# Patient Record
Sex: Female | Born: 1990 | Race: Black or African American | Hispanic: No | Marital: Single | Smoking: Current every day smoker
Health system: Southern US, Community
[De-identification: ages and names within clinical notes are randomized; demographics above are authoritative.]

---
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
## 2011-03-29 ENCOUNTER — Emergency Department: Payer: Self-pay | Admitting: Emergency Medicine

## 2011-03-29 LAB — COMPREHENSIVE METABOLIC PANEL
Albumin: 3.5 g/dL (ref 3.4–5.0)
Alkaline Phosphatase: 53 U/L (ref 50–136)
Anion Gap: 8 (ref 7–16)
Calcium, Total: 8.7 mg/dL (ref 8.5–10.1)
Chloride: 108 mmol/L — ABNORMAL HIGH (ref 98–107)
Co2: 26 mmol/L (ref 21–32)
Creatinine: 0.89 mg/dL (ref 0.60–1.30)
EGFR (African American): >60
EGFR (Non-African Amer.): >60
Glucose: 77 mg/dL (ref 65–99)
Osmolality: 282 (ref 275–301)
SGOT(AST): 26 U/L (ref 15–37)
SGPT (ALT): 19 U/L
Sodium: 142 mmol/L (ref 136–145)

## 2011-03-29 LAB — CBC
HCT: 39.7 % (ref 35.0–47.0)
HGB: 13.4 g/dL (ref 12.0–16.0)
MCH: 30.7 pg (ref 26.0–34.0)
MCV: 91 fL (ref 80–100)
RBC: 4.37 10*6/uL (ref 3.80–5.20)
WBC: 6 10*3/uL (ref 3.6–11.0)

## 2011-03-29 LAB — DRUG SCREEN, URINE
Barbiturates, Ur Screen: NEGATIVE (ref ?–200)
Benzodiazepine, Ur Scrn: NEGATIVE (ref ?–200)
Cannabinoid 50 Ng, Ur ~~LOC~~: POSITIVE (ref ?–50)
Cocaine Metabolite,Ur ~~LOC~~: NEGATIVE (ref ?–300)
MDMA (Ecstasy)Ur Screen: NEGATIVE (ref ?–500)
Phencyclidine (PCP) Ur S: NEGATIVE (ref ?–25)
Tricyclic, Ur Screen: NEGATIVE (ref ?–1000)

## 2011-03-29 LAB — URINALYSIS, COMPLETE
Bilirubin,UR: NEGATIVE
Glucose,UR: NEGATIVE mg/dL (ref 0–75)
Leukocyte Esterase: NEGATIVE
Nitrite: NEGATIVE
Protein: 30
Specific Gravity: 1.031 (ref 1.003–1.030)
Squamous Epithelial: 9

## 2011-03-29 LAB — ETHANOL: Ethanol: <3 mg/dL

## 2011-03-29 LAB — TSH: Thyroid Stimulating Horm: 0.87 u[IU]/mL

## 2014-02-22 ENCOUNTER — Emergency Department (HOSPITAL_COMMUNITY)
Admission: EM | Admit: 2014-02-22 | Discharge: 2014-02-22 | Disposition: A | Discharge status: Home / Self Care | Payer: Self-pay | Attending: Emergency Medicine | Admitting: Emergency Medicine

## 2014-02-22 ENCOUNTER — Encounter (HOSPITAL_COMMUNITY): Payer: Self-pay | Admitting: *Deleted

## 2014-02-22 DIAGNOSIS — K088 Other specified disorders of teeth and supporting structures: Secondary | ICD-10-CM | POA: Insufficient documentation

## 2014-02-22 DIAGNOSIS — K0889 Other specified disorders of teeth and supporting structures: Secondary | ICD-10-CM

## 2014-02-22 DIAGNOSIS — Z720 Tobacco use: Secondary | ICD-10-CM | POA: Insufficient documentation

## 2014-02-22 MED ORDER — KETOROLAC TROMETHAMINE 10 MG PO TABS
10.0000 mg | ORAL_TABLET | Freq: Once | ORAL | Status: AC
  Administered 2014-02-22: 10 mg via ORAL
  Filled 2014-02-22: qty 1

## 2014-02-22 MED ORDER — AMOXICILLIN 250 MG PO CAPS
500.0000 mg | ORAL_CAPSULE | Freq: Once | ORAL | Status: AC
  Administered 2014-02-22: 500 mg via ORAL
  Filled 2014-02-22: qty 2

## 2014-02-22 MED ORDER — HYDROCODONE-ACETAMINOPHEN 5-325 MG PO TABS
2.0000 | ORAL_TABLET | Freq: Once | ORAL | Status: AC
Start: 2014-02-22 — End: 2014-02-22
  Administered 2014-02-22: 2 via ORAL
  Filled 2014-02-22: qty 2

## 2014-02-22 MED ORDER — HYDROCODONE-ACETAMINOPHEN 5-325 MG PO TABS: 1.0000 | ORAL_TABLET | ORAL | Status: DC | PRN

## 2014-02-22 MED ORDER — IBUPROFEN 800 MG PO TABS: 800.0000 mg | ORAL_TABLET | Freq: Three times a day (TID) | ORAL | Status: DC

## 2014-02-22 MED ORDER — PROMETHAZINE HCL 12.5 MG PO TABS
12.5000 mg | ORAL_TABLET | Freq: Once | ORAL | Status: AC
  Administered 2014-02-22: 12.5 mg via ORAL
  Filled 2014-02-22: qty 1

## 2014-02-22 MED ORDER — AMOXICILLIN 500 MG PO CAPS: 500.0000 mg | ORAL_CAPSULE | Freq: Three times a day (TID) | ORAL | Status: DC

## 2014-02-22 NOTE — Discharge Instructions (Signed)
IT IS IMPORTANT THAT YOU SEE A DENTIST AS SOON AS POSSIBLE. PLEASE USE AMOXIL AND IBUPROFEN DAILY  WITH FOOD. MAY USE NORCO FOR PAIN IF NEEDED. THIS MEDICATION MAY CAUSE DROWSINESS, USE WITH CAUTION. Dental Pain A tooth ache may be caused by cavities (tooth decay). Cavities expose the nerve of the tooth to air and hot or cold temperatures. It may come from an infection or abscess (also called a boil or furuncle) around your tooth. It is also often caused by dental caries (tooth decay). This causes the pain you are having. DIAGNOSIS  Your caregiver can diagnose this problem by exam. TREATMENT   If caused by an infection, it may be treated with medications which kill germs (antibiotics) and pain medications as prescribed by your caregiver. Take medications as directed.  Only take over-the-counter or prescription medicines for pain, discomfort, or fever as directed by your caregiver.  Whether the tooth ache today is caused by infection or dental disease, you should see your dentist as soon as possible for further care. SEEK MEDICAL CARE IF: The exam and treatment you received today has been provided on an emergency basis only. This is not a substitute for complete medical or dental care. If your problem worsens or new problems (symptoms) appear, and you are unable to meet with your dentist, call or return to this location. SEEK IMMEDIATE MEDICAL CARE IF:   You have a fever.  You develop redness and swelling of your face, jaw, or neck.  You are unable to open your mouth.  You have severe pain uncontrolled by pain medicine. MAKE SURE YOU:   Understand these instructions.  Will watch your condition.  Will get help right away if you are not doing well or get worse. Document Released: 01/25/2005 Document Revised: 04/19/2011 Document Reviewed: 09/13/2007 [facility] Patient Information 2015 [employer]. This information is not intended to replace advice given to you by your health care  provider. Make sure you discuss any questions you have with your health care provider.

## 2014-02-22 NOTE — ED Notes (Signed)
Pt c/o pain to a tooth on the right bottom side of her jaw x 3 days.

## 2014-02-22 NOTE — ED Provider Notes (Signed)
CSN: [id number]     Arrival date & time 02/22/14  2153 History   First MD Initiated Contact with Patient 02/22/14 2210     No chief complaint on file.    (Consider location/radiation/quality/duration/timing/severity/associated sxs/prior Treatment) HPI Comments: Patient is a 24 year old female who presents to the emergency department with a complaint of toothache. The patient states that the past 2 or 3 days she's been having pain of the right lower jaw area. She states that now the pain is radiating to her ear, and making her feel as though her ear is going to "busted". The patient has not had any high fever related to this. She's not had any problem breathing or swallowing. She has not spoken with the dentist about this problem. She presents now for assistance with her pain.  The history is provided by the patient.    History reviewed. No pertinent past medical history. History reviewed. No pertinent past surgical history. History reviewed. No pertinent family history. History  Substance Use Topics  . Smoking status: Current Every Day Smoker  . Smokeless tobacco: Not on file  . Alcohol Use: No   OB History    No data available     Review of Systems  Constitutional: Negative for activity change.       All ROS Neg except as noted in HPI  HENT: Positive for dental problem. Negative for nosebleeds.   Eyes: Negative for photophobia and discharge.  Respiratory: Negative for cough, shortness of breath and wheezing.   Cardiovascular: Negative for chest pain and palpitations.  Gastrointestinal: Negative for abdominal pain and blood in stool.  Genitourinary: Negative for dysuria, frequency and hematuria.  Musculoskeletal: Negative for back pain, arthralgias and neck pain.  Skin: Negative.   Neurological: Negative for dizziness, seizures and speech difficulty.  Psychiatric/Behavioral: Negative for hallucinations and confusion.      Allergies  Peanuts  Home Medications   Prior to  Admission medications   Not on File   BP 143/79 mmHg  Pulse 53  Temp(Src) 98 F (36.7 C)  Resp 20  Ht 5' 6.5" (1.689 m)  Wt 299 lb (135.626 kg)  BMI 47.54 kg/m2  SpO2 100%  LMP 01/27/2014 Physical Exam  Constitutional: She is oriented to person, place, and time. She appears well-developed and well-nourished.  Non-toxic appearance.  HENT:  Head: Normocephalic.  Right Ear: Tympanic membrane and external ear normal.  Left Ear: Tympanic membrane and external ear normal.  There is pain to percussion of the right lower molars, particularly the third molar on the right. There is swelling of the gum at this area. There is no visible abscess noted. The airway is patent. The uvula is in the midline. There is no swelling under the tongue. The patient speaks clearly, and is able to mobilize secretions without problem.  Eyes: EOM and lids are normal. Pupils are equal, round, and reactive to light.  Neck: Normal range of motion. Neck supple. Carotid bruit is not present.  Cardiovascular: Normal rate, regular rhythm, normal heart sounds, intact distal pulses and normal pulses.   Pulmonary/Chest: Breath sounds normal. No respiratory distress.  Abdominal: Soft. Bowel sounds are normal. There is no tenderness. There is no guarding.  Musculoskeletal: Normal range of motion.  Lymphadenopathy:       Head (right side): No submandibular adenopathy present.       Head (left side): No submandibular adenopathy present.    She has no cervical adenopathy.  Neurological: She is alert and oriented to person,  place, and time. She has normal strength. No cranial nerve deficit or sensory deficit.  Skin: Skin is warm and dry.  Psychiatric: She has a normal mood and affect. Her speech is normal.  Nursing note and vitals reviewed.   ED Course  Procedures (including critical care time) Labs Review Labs Reviewed - No data to display  Imaging Review No results found.   EKG Interpretation None       MDM  Patient has right side lower dental pain. No visible abscess appreciated. No evidence for Ludwig's angina. Vital signs are within normal limits.  Patient advised to see a dentist as sone as possible. Prescription for Amoxil, ibuprofen 800 mg, and Norco given to the patient.    Final diagnoses:  None    *I have reviewed nursing notes, vital signs, and all appropriate lab and imaging results for this patient.[address]    Cara Thaxton M Althea Backs, PA-C 02/22/14 2310  Vanetta MuldersScott Zackowski, MD 02/23/14 1710

## 2014-02-22 NOTE — ED Notes (Signed)
Patient verbalizes understanding of discharge instructions, prescription medications, home care and follow up care. Patient ambulatory out of department at this time with family. 

## 2014-02-26 NOTE — Care Management Note (Signed)
Pt was called for call back by Etheleen MayhewJo McCollum, RN, who called this CM concerning the fact that the pt stated she had no money to buy her medications. Called and spoke with pt. She states she has a rx for Amoxicillin, motrin and hydrocodone, but has no money, and also has no money for the dentist. Explained the [facility], and that each medication is $3, narcotics are not covered, and OTC meds such as Motrin/ Advil are not covered. Also the Match voucher is only given once a year, so if able to pay for the  meds, if inexpensive, it best to not use the voucher in case it is needed for more expensive meds later.  Pt did not know the cost of the ABX because she had not taken it to the drugstore yet. She hung up and called the pharmacy and called CM back. ABX is only $13. She said she would try to get the meds, then called back to ask for the voucher.  [facility] Pt was also given the [facility], where she may be able to get dental services at a reduced rate, or free. Pt very appreciative of this service.

## 2014-03-13 ENCOUNTER — Encounter (HOSPITAL_COMMUNITY): Payer: Self-pay | Admitting: Emergency Medicine

## 2014-03-13 ENCOUNTER — Emergency Department (HOSPITAL_COMMUNITY)
Admission: EM | Admit: 2014-03-13 | Discharge: 2014-03-13 | Disposition: A | Discharge status: Home / Self Care | Payer: Self-pay | Attending: Emergency Medicine | Admitting: Emergency Medicine

## 2014-03-13 DIAGNOSIS — K088 Other specified disorders of teeth and supporting structures: Secondary | ICD-10-CM | POA: Insufficient documentation

## 2014-03-13 DIAGNOSIS — Z791 Long term (current) use of non-steroidal anti-inflammatories (NSAID): Secondary | ICD-10-CM | POA: Insufficient documentation

## 2014-03-13 DIAGNOSIS — K029 Dental caries, unspecified: Secondary | ICD-10-CM | POA: Insufficient documentation

## 2014-03-13 DIAGNOSIS — K0889 Other specified disorders of teeth and supporting structures: Secondary | ICD-10-CM

## 2014-03-13 DIAGNOSIS — Z720 Tobacco use: Secondary | ICD-10-CM | POA: Insufficient documentation

## 2014-03-13 MED ORDER — HYDROCODONE-ACETAMINOPHEN 5-325 MG PO TABS
1.0000 | ORAL_TABLET | Freq: Once | ORAL | Status: AC
Start: 2014-03-13 — End: 2014-03-13
  Administered 2014-03-13: 1 via ORAL
  Filled 2014-03-13: qty 1

## 2014-03-13 MED ORDER — HYDROCODONE-ACETAMINOPHEN 5-325 MG PO TABS: ORAL_TABLET | ORAL | Status: DC

## 2014-03-13 MED ORDER — AMOXICILLIN 250 MG PO CAPS
500.0000 mg | ORAL_CAPSULE | Freq: Once | ORAL | Status: AC
  Administered 2014-03-13: 500 mg via ORAL
  Filled 2014-03-13: qty 2

## 2014-03-13 NOTE — ED Notes (Signed)
Patient complaining of lower right dental pain x 2 days.  

## 2014-03-13 NOTE — ED Notes (Signed)
Pt verbalized understanding of no driving and to use caution within 4 hours of taking pain meds due to meds cause drowsiness 

## 2014-03-13 NOTE — ED Provider Notes (Signed)
CSN: [id number]     Arrival date & time 03/13/14  1814 History   First MD Initiated Contact with Patient 03/13/14 2035     Chief Complaint  Patient presents with  . Dental Pain     (Consider location/radiation/quality/duration/timing/severity/associated sxs/prior Treatment) HPI  Christina Wiley is a 24 y.o. female who presents to the Emergency Department complaining of dental pain.  Patient was seen here for same on 02/22/14.  She states she was not able to fill her prescriptions and returns due to increased pain.  She states she tried to take the prescriptions to [facility], but they would no longer fill them.  She has been taking ibuprofen without relief.  She denies fever, chills, facial swelling or difficulty swallowing or breathing.     History reviewed. No pertinent past medical history. History reviewed. No pertinent past surgical history. History reviewed. No pertinent family history. History  Substance Use Topics  . Smoking status: Current Every Day Smoker  . Smokeless tobacco: Not on file  . Alcohol Use: No   OB History    No data available     Review of Systems  Constitutional: Negative for fever and appetite change.  HENT: Positive for dental problem. Negative for congestion, facial swelling, sore throat and trouble swallowing.   Eyes: Negative for pain and visual disturbance.  Musculoskeletal: Negative for neck pain and neck stiffness.  Neurological: Negative for dizziness, facial asymmetry and headaches.  Hematological: Negative for adenopathy.  All other systems reviewed and are negative.     Allergies  Peanuts  Home Medications   Prior to Admission medications   Medication Sig Start Date End Date Taking? Authorizing Provider  HYDROcodone-acetaminophen (NORCO/VICODIN) 5-325 MG per tablet Take 1 tablet by mouth every 4 (four) hours as needed. Patient not taking: Reported on 03/13/2014 02/22/14   Kathie Dike, PA-C  ibuprofen (ADVIL,MOTRIN) 800 MG tablet  Take 1 tablet (800 mg total) by mouth 3 (three) times daily. Patient not taking: Reported on 03/13/2014 02/22/14   Kathie Dike, PA-C   BP 133/88 mmHg  Pulse 66  Temp(Src) 98.4 F (36.9 C) (Oral)  Resp 18  Ht 5' 6.5" (1.689 m)  Wt 299 lb (135.626 kg)  BMI 47.54 kg/m2  SpO2 100%  LMP 03/03/2014 Physical Exam  Constitutional: She is oriented to person, place, and time. She appears well-developed and well-nourished. No distress.  HENT:  Head: Normocephalic and atraumatic.  Right Ear: Tympanic membrane and ear canal normal.  Left Ear: Tympanic membrane and ear canal normal.  Mouth/Throat: Uvula is midline, oropharynx is clear and moist and mucous membranes are normal. No trismus in the jaw. Dental caries present. No dental abscesses or uvula swelling.  Tenderness to palpation of the right lower third molar, appears slightly impacted.  No facial swelling, obvious dental abscess, trismus, or sublingual abnml.    Neck: Normal range of motion. Neck supple.  Cardiovascular: Normal rate, regular rhythm and normal heart sounds.   No murmur heard. Pulmonary/Chest: Effort normal and breath sounds normal.  Musculoskeletal: Normal range of motion.  Lymphadenopathy:    She has no cervical adenopathy.  Neurological: She is alert and oriented to person, place, and time. She exhibits normal muscle tone. Coordination normal.  Skin: Skin is warm and dry.  Nursing note and vitals reviewed.   ED Course  Procedures (including critical care time) Labs Review Labs Reviewed - No data to display  Imaging Review No results found.   EKG Interpretation None  MDM   Final diagnoses:  Pain, dental    Patient was seen here last month for same.  Did not get previous Rx's filled due to lack of funds.  Amoxicillin was filled, but has not been picked up yet, has prescription for ibuprofen and Vicodin with her that were never field. I verified this with [facility] as well. Patient states that she has  money as of today to get her medications and she was advised to arrange follow-up with dentistry  No concerning sx's of infection to the floor of the mouth or deep structures of the neck.  She appears stable for d/c   Hellena Pridgen L. Trisha Mangleriplett, PA-C 03/16/14 1405  Gerhard Munchobert Lockwood, MD 03/21/14 [phone]

## 2014-03-13 NOTE — Discharge Instructions (Signed)
Dental Pain °Toothache is pain in or around a tooth. It may get worse with chewing or with cold or heat.  °HOME CARE °· Your dentist may use a numbing medicine during treatment. If so, you may need to avoid eating until the medicine wears off. Ask your dentist about this. °· Only take medicine as told by your dentist or doctor. °· Avoid chewing food near the painful tooth until after all treatment is done. Ask your dentist about this. °GET HELP RIGHT AWAY IF:  °· The problem gets worse or new problems appear. °· You have a fever. °· There is redness and puffiness (swelling) of the face, jaw, or neck. °· You cannot open your mouth. °· There is pain in the jaw. °· There is very bad pain that is not helped by medicine. °MAKE SURE YOU:  °· Understand these instructions. °· Will watch your condition. °· Will get help right away if you are not doing well or get worse. °Document Released: 07/14/2007 Document Revised: 04/19/2011 Document Reviewed: 07/14/2007 °ExitCare® Patient Information ©2015 [employer]. This information is not intended to replace advice given to you by your health care provider. Make sure you discuss any questions you have with your health care provider. ° ° ° °Emergency Department Resource Guide °1) Find a Doctor and Pay Out of Pocket °Although you won't have to find out who is covered by your insurance plan, it is a good idea to ask around and get recommendations. You will then need to call the office and see if the doctor you have chosen will accept you as a new patient and what types of options they offer for patients who are self-pay. Some doctors offer discounts or will set up payment plans for their patients who do not have insurance, but you will need to ask so you aren't surprised when you get to your appointment. ° °2) Contact Your Local Health Department °Not all health departments have doctors that can see patients for sick visits, but many do, so it is worth a call to see if yours does.  If you don't know where your local health department is, you can check in your phone book. The CDC also has a tool to help you locate your state's health department, and many state websites also have listings of all of their local health departments. ° °3) Find a Walk-in Clinic °If your illness is not likely to be very severe or complicated, you may want to try a walk in clinic. These are popping up all over the country in pharmacies, drugstores, and shopping centers. They're usually staffed by nurse practitioners or physician assistants that have been trained to treat common illnesses and complaints. They're usually fairly quick and inexpensive. However, if you have serious medical issues or chronic medical problems, these are probably not your best option. ° °No Primary Care Doctor: °- Call [facility] at  832-8000 - they can help you locate a primary care doctor that  accepts your insurance, provides certain services, etc. °- Physician Referral Service- 1-800-533-3463 ° °Chronic Pain Problems: °Organization         Address  Phone   Notes  °[facility]  (336) 297-2271 Patients need to be referred by their primary care doctor.  ° °Medication Assistance: °Organization         Address  Phone   Notes  °[place] [facility] [address] °[place], [place] [address] (336) 641-8030 --Must be a resident   of [place] °-- Must have NO insurance coverage whatsoever (no Medicaid/ Medicare, etc.) °-- The pt. MUST have a primary care doctor that directs their care regularly and follows them in the community °  °[facility]  (866) 331-1348   °[employer]  (888) 892-1162   ° °Agencies that provide inexpensive medical care: °Organization         Address  Phone   Notes  °[facility]  (336) 832-8035   °[facility]    (336) 832-7272   °[facility] [address] °[place], [place] [address] (336) 832-4777   °[facility] [address], °[place] (336) 271-4999   °[facility]    (336) 373-0678   °[facility]    (336) 272-1050   °[facility] ° [address], [place] Phone:  (336) 832-4444, Fax:  (336) 832-4440 Hours of Operation:  9 am - 6 pm, M-F.  Also accepts Medicaid/Medicare and self-pay.  °[facility] ° [address], [place] Phone: (336) 832-3150, Fax: (336) 832-3151. Hours of Operation:  8:30 am - 5:30 pm, M-F.  Also accepts Medicaid and self-pay.  °[facility] [place] [address], [place] Phone: (336) 878-6027   °[facility] [address], [place], [place] (336)723-1848, Ext. 123 Mondays & Thursdays: 7-9 AM.  First 15 patients are seen on a first come, first serve basis. °  ° °Medicaid-accepting [place] Providers: ° °Organization         Address  Phone   Notes  °[facility] [address], [place] (336) 641-2100 Also accepts self-pay patients.  °[facility] [address], [place] ° (336) 856-9996   °[facility] [address], [place] (336) 288-8857   °[facility] [address] [place] [address], [place] (336) 299-7000   °Veita Bland [address], [place]  ° (336) 373-1557 Only accepts [place] Access Medicaid patients after they have their name applied to their card.  ° °Self-Pay (no insurance) in [place]: ° °Organization         Address  Phone   Notes  °Sickle Cell Patients, [facility] [address], [place] (336) 832-1970   °[facility] [address], [place] (336) 832-4400   °[facility] ° [address], [place] (336) 992-4800   °[facility]/Dr. Osei-Bonsu ° [address] [place] [address], [place] or [address], [place] (336) 841-8500  Phone number for both [place] and [place] locations is the same.  °[facility] [address], [place] (336) 299-0000   °[facility] [address] [place] [address], [place] or [address] (336) 852-7530 °(336) 878-2260   °[facility] [address],  (336) 350-1642, phone; (336) 294-5005, fax Sees patients 1st and 3rd Saturday of every month.  Must not qualify for public or private insurance (i.e. Medicaid, Medicare, [place] Health Choice, Veterans' Benefits) • Household income should be no more than 200% of the poverty level •The clinic cannot treat you if you are pregnant or think you are pregnant • Sexually transmitted diseases are not treated at the clinic.  ° ° °Dental Care: °Organization         Address    Phone  Notes  °[facility] [facility] [address], [place] (336) 641-6152 Accepts children up to age 21 who are enrolled in Medicaid or [place] Health Choice; pregnant women with a Medicaid card; and children who have applied for Medicaid or [place] Health Choice, but were declined, whose parents can pay a reduced fee at time of service.  °[facility] [place]  [address], [place] (336) 641-7733 Accepts children up to age 21 who are enrolled in Medicaid or [place] Health Choice; pregnant women with a Medicaid card; and children who have applied for Medicaid or [place] Health Choice, but were declined, whose parents can pay a reduced fee at time of service.  °[facility] ° [address], [place] (336) 641-4533 Patients are seen by appointment only. Walk-ins are not accepted. [facility] will see patients 18 years of age and older. °Monday - Tuesday (8am-5pm) °Most Wednesdays (8:30-5pm) °$30 per visit, cash only  °[facility] ° [address], [place] (336) 641-4533 Patients are seen by appointment  only. Walk-ins are not accepted. [facility] will see patients 18 years of age and older. °One Wednesday Evening (Monthly: Volunteer Based).  $30 per visit, cash only  °[facility]  (919) 537-3737 for adults; Children under age 4, call Graduate Pediatric Dentistry at (919) [phone]. Children aged 4-14, please call (919) 537-3737 to request a pediatric application. ° Dental services are provided in all areas of dental care including fillings, crowns and bridges, complete and partial dentures, implants, gum treatment, root canals, and extractions. Preventive care is also provided. Treatment is provided to both adults and children. °Patients are selected via a lottery and there is often a waiting list. °  °[facility] [address], °[place] ° (336) 763-8833 [email or web address] °  °[facility] [address], [place], [place] (336)723-1848, Ext. 123 Second and Fourth Thursday of each month, opens at 6:30 AM; Clinic ends at 9 AM.  Patients are seen on a first-come first-served basis, and a limited number are seen during each clinic.  ° °[facility] ° [address], [place], [place] (336) 723-7904   Eligibility Requirements °You must have lived in [place], [place], or [place] counties for at least the last three months. °  You cannot be eligible for state or federal sponsored healthcare insurance, including Veterans Administration, Medicaid, or Medicare. °  You generally cannot be eligible for healthcare insurance through your employer.  °  How to apply: °Eligibility screenings are held every Tuesday and Wednesday afternoon from 1:00 pm until 4:00 pm. You do not need an appointment for the interview!  °[facility] [address], [place], [place] 336-631-2330   °[facility]  336-342-8273   °[facility]  336-703-3100   °[facility]  336-570-6415   ° °Behavioral Health  Resources in the Community: °Intensive Outpatient Programs °Organization         Address  Phone  Notes  °[facility] [address], [place], [place] 336-878-6098   °[facility] [address], [place], [place] 336-832-9800   °[facility] [address], [place], [place] ° 336-882-2125   °[facility] [address],  °[place], [place] 1-800-853-5163 or 336-641-4981   °Substance Abuse Resources °Organization           Address  Phone  Notes  °[facility]  336-882-2125   °[facility]  336-784-9470   °[facility]  336-285-9073   °[facility]  336-845-3988   °Residential & Outpatient Substance Abuse Program  1-800-659-3381   °Psychological Services °Organization         Address  Phone  Notes  °[facility]  336- 832-9600   °[facility]  336- 378-7881   °[facility] [address], [place] 1-800-853-5163 or 336-641-4981   ° °Mobile Crisis Teams °Organization         Address  Phone  Notes  °[facility], Mobile Crisis Care Unit  1-877-626-1772   °Assertive °[facility] ° [address] [place], [place] 336-834-9664   °Sharon DeEsch [address] °[place] 336-554-5454   ° °Self-Help/Support Groups °Organization         Address  Phone             Notes  °[facility] - variety of support groups  336- 373-1402 Call for more information  °Narcotics Anonymous (NA), [facility] [address], °[place]  2 meetings at this location  ° °Residential Treatment Programs °Organization         Address  Phone  Notes  °[facility] [address],    °[place]  1-866-801-8205   °[facility] ° [address], [place], [place] 704-293-8524   °[facility] [address], [place] 336-845-3988 Admissions: 8am-3pm M-F  °[facility] [address]    °[place], [place] 336-841-1104   °[facility] [address], [place], [place] 336-379-7146   °[facility] [address]  °[place], [place] 336-285-9073   °[facility] Intensive Outpatient [address], [place], [place] 336-852-3033   °[facility] [address]  °[place], [place] 1-877-615-2722 or 336-784-9470   °[facility] [address] [place], [place] 336-227-7417 Accepts Medicaid  °[facility] [address]  ° [place] 1-800-659-3381 Substance Abuse/Addiction Treatment  ° °[place] Behavioral Health Resources °Organization         Address  Phone  Notes  °[facility]  (888) 581-9988   °Julie Brannon, PhD [address] [place], [place]   (336) 349-5553 or (336) 951-0000   °[facility]   [address] °[place], [place] (336) 349-4454   °[facility] [address], [place], [place] (336) 342-8316 Insurance/Medicaid/sponsorship through [facility]  °[facility] [address]                                    [place], [place] (336) 342-8316 Therapy/tele-psych/case  °[facility] [address]  ° [place], [place] (336) 349-2233    °Dr. Arfeen  (336) 349-4544   °[facility] [place]  [employer] [place] [facility] 1) [address], [place] °2) [address], [place] °3)  [address], [place] (336) 349-3220 °(336) 342-7768 ° °(336) 342-8140   °[place] [facility] (336) 342-1394 or (336) 342-3537 (After Hours)    ° °  °

## 2014-03-13 NOTE — ED Notes (Signed)
Pt received dental services list

## 2014-04-01 ENCOUNTER — Encounter (HOSPITAL_COMMUNITY): Payer: Self-pay | Admitting: Emergency Medicine

## 2014-04-01 ENCOUNTER — Emergency Department (HOSPITAL_COMMUNITY)
Admission: EM | Admit: 2014-04-01 | Discharge: 2014-04-01 | Disposition: A | Discharge status: Home / Self Care | Payer: Self-pay | Attending: Emergency Medicine | Admitting: Emergency Medicine

## 2014-04-01 DIAGNOSIS — Z720 Tobacco use: Secondary | ICD-10-CM | POA: Insufficient documentation

## 2014-04-01 DIAGNOSIS — K029 Dental caries, unspecified: Secondary | ICD-10-CM | POA: Insufficient documentation

## 2014-04-01 MED ORDER — HYDROCODONE-ACETAMINOPHEN 5-325 MG PO TABS: 1.0000 | ORAL_TABLET | ORAL | Status: DC | PRN

## 2014-04-01 MED ORDER — AMOXICILLIN 500 MG PO CAPS: 500.0000 mg | ORAL_CAPSULE | Freq: Three times a day (TID) | ORAL | Status: DC

## 2014-04-01 NOTE — ED Notes (Addendum)
Dental pain for 2 -3 days, seen here, but waited too long to get rx filled. Pain rt mandibular molar

## 2014-04-01 NOTE — ED Notes (Signed)
Pt states she has been having dental pain since 03/11/14 and was seen here.  Dropped off rx's but did not pick them up until today and was told they could not be filled because it had been >10 days.

## 2014-04-01 NOTE — ED Provider Notes (Signed)
CSN: [id number]     Arrival date & time 04/01/14  1201 History  This chart was scribed for Ivery QualeHobson Raynell Upton, PA-C with Vida RollerBrian D Miller, MD by Tonye RoyaltyJoshua Chen, ED Scribe. This patient was seen in room APFT20/APFT20 and the patient's care was started at 1:19 PM.    Chief Complaint  Patient presents with  . Dental Pain   Patient is a 24 y.o. female presenting with tooth pain. The history is provided by the patient. No language interpreter was used.  Dental Pain Location:  Lower Lower teeth location: right molar. Quality:  Unable to specify Severity:  Mild Onset quality:  Sudden Duration:  5 weeks Timing:  Intermittent Progression:  Unchanged Chronicity:  Recurrent Context: dental caries   Relieved by:  None tried Worsened by:  Nothing tried Ineffective treatments:  None tried Associated symptoms: no facial swelling and no fever   Risk factors: smoking     HPI Comments: Christina Wiley is a 24 y.o. female who presents to the Emergency Department complaining of dental pain with onset on 02/22/2014, recurring 3 days ago. She has been evaluated here twice for the problem. She states states she was evaluated here on 02/22/2014; she was prescribed pain medication but states she was not able to get it filled at the time because she did not have the money. She came back for evaluation on 03/13/2014 and was prescribed pain medication again. She states that when she had the money to get it filled, she was not able to because the prescription was expired.  History reviewed. No pertinent past medical history. History reviewed. No pertinent past surgical history. History reviewed. No pertinent family history. History  Substance Use Topics  . Smoking status: Current Every Day Smoker -- 0.50 packs/day    Types: Cigarettes  . Smokeless tobacco: Not on file  . Alcohol Use: No   OB History    No data available     Review of Systems  Constitutional: Negative for fever.  HENT: Positive for dental problem.  Negative for facial swelling.   All other systems reviewed and are negative.     Allergies  Peanuts  Home Medications   Prior to Admission medications   Medication Sig Start Date End Date Taking? Authorizing Provider  HYDROcodone-acetaminophen (NORCO/VICODIN) 5-325 MG per tablet Take one-two tabs po q 4-6 hrs prn pain 03/13/14   Tammy L. Triplett, PA-C   BP 117/56 mmHg  Pulse 68  Temp(Src) 98.2 F (36.8 C) (Oral)  Resp 18  Ht 5\' 6"  (1.676 m)  Wt 300 lb (136.079 kg)  BMI 48.44 kg/m2  SpO2 100%  LMP 03/03/2014 Physical Exam  Constitutional: She is oriented to person, place, and time. She appears well-developed and well-nourished.  HENT:  Head: Normocephalic and atraumatic.  Right posterior lower molar that is decayed to the gumline There is swelling of the gum There is also a cavity of the second left lower molar with selling of the gum The airway is patent No swelling under the tongue  Eyes: Conjunctivae are normal.  Neck: Normal range of motion. Neck supple.  No palpable lymph nodes  Cardiovascular: Normal rate, regular rhythm and normal heart sounds.   No murmur heard. Pulmonary/Chest: Effort normal and breath sounds normal. No respiratory distress. She has no wheezes. She has no rales.  Musculoskeletal: Normal range of motion.  Neurological: She is alert and oriented to person, place, and time.  Skin: Skin is warm and dry.  Psychiatric: She has a normal mood and  affect.  Nursing note and vitals reviewed.   ED Course  Procedures (including critical care time)  DIAGNOSTIC STUDIES: Oxygen Saturation is 100% on room air, normal by my interpretation.    COORDINATION OF CARE: 1:28 PM Discussed treatment plan with patient at beside, including amoxicillin, Norco, and referral to dentist. The patient agrees with the plan, agrees to get her prescriptions filled, and has no further questions at this time.   Labs Review Labs Reviewed - No data to display  Imaging  Review No results found.   EKG Interpretation None      MDM  Pt has dental caries. No visible abscess noted. Pt did not have the money to fill her Rx and it stayed in the pharmacy too long. Pt return for evaluation of her dental condition, and rx to be re-done.   Final diagnoses:  None    *I have reviewed nursing notes, vital signs, and all appropriate lab and imaging results for this patient.**  **I personally performed the services described in this documentation, which was scribed in my presence. The recorded information has been reviewed and is accurate.Kathie Dike, PA-C 04/01/14 1334  Vida Roller, MD 04/01/14 [phone]

## 2014-04-01 NOTE — Discharge Instructions (Signed)
It is important that you see a dentist as soon as possible. Please call one of the clinics from the resources given to assist with pain management, and your general health and wellness.  Dental Caries Dental caries is tooth decay. This decay can cause a hole in teeth (cavity) that can get bigger and deeper over time. HOME CARE  Brush and floss your teeth. Do this at least two times a day.  Use a fluoride toothpaste.  Use a mouth rinse if told by your dentist or doctor.  Eat less sugary and starchy foods. Drink less sugary drinks.  Avoid snacking often on sugary and starchy foods. Avoid sipping often on sugary drinks.  Keep regular checkups and cleanings with your dentist.  Use fluoride supplements if told by your dentist or doctor.  Allow fluoride to be applied to teeth if told by your dentist or doctor. Document Released: 11/04/2007 Document Revised: 06/11/2013 Document Reviewed: 01/28/2012 [facility] Patient Information 2015 [place], [place]. This information is not intended to replace advice given to you by your health care provider. Make sure you discuss any questions you have with your health care provider.

## 2019-08-24 ENCOUNTER — Emergency Department
Admission: EM | Admit: 2019-08-24 | Discharge: 2019-08-24 | Disposition: A | Discharge status: Home / Self Care | Payer: Medicaid Other | Attending: Student in an Organized Health Care Education/Training Program | Admitting: Student in an Organized Health Care Education/Training Program

## 2019-08-24 ENCOUNTER — Other Ambulatory Visit: Payer: Self-pay

## 2019-08-24 ENCOUNTER — Emergency Department: Payer: Medicaid Other

## 2019-08-24 DIAGNOSIS — F17210 Nicotine dependence, cigarettes, uncomplicated: Secondary | ICD-10-CM | POA: Insufficient documentation

## 2019-08-24 DIAGNOSIS — Y69 Unspecified misadventure during surgical and medical care: Secondary | ICD-10-CM

## 2019-08-24 DIAGNOSIS — T405X1A Poisoning by cocaine, accidental (unintentional), initial encounter: Secondary | ICD-10-CM | POA: Insufficient documentation

## 2019-08-24 LAB — CBC WITH DIFFERENTIAL/PLATELET
Abs Immature Granulocytes: 0.02 10*3/uL (ref 0.00–0.07)
Basophils Absolute: 0 10*3/uL (ref 0.0–0.1)
Basophils Relative: 1 %
Eosinophils Absolute: 0.1 10*3/uL (ref 0.0–0.5)
Eosinophils Relative: 1 %
HCT: 38.5 % (ref 36.0–46.0)
Hemoglobin: 13.5 g/dL (ref 12.0–15.0)
Immature Granulocytes: 0 %
Lymphocytes Relative: 24 %
Lymphs Abs: 1.9 10*3/uL (ref 0.7–4.0)
MCH: 30.3 pg (ref 26.0–34.0)
MCHC: 35.1 g/dL (ref 30.0–36.0)
MCV: 86.3 fL (ref 80.0–100.0)
Monocytes Absolute: 0.6 10*3/uL (ref 0.1–1.0)
Monocytes Relative: 8 %
Neutro Abs: 5.1 10*3/uL (ref 1.7–7.7)
Neutrophils Relative %: 66 %
Platelets: 329 10*3/uL (ref 150–400)
RBC: 4.46 MIL/uL (ref 3.87–5.11)
RDW: 14.4 % (ref 11.5–15.5)
WBC: 7.7 10*3/uL (ref 4.0–10.5)
nRBC: 0 % (ref 0.0–0.2)

## 2019-08-24 LAB — COMPREHENSIVE METABOLIC PANEL
ALT: 15 U/L (ref 0–44)
AST: 15 U/L (ref 15–41)
Albumin: 3.7 g/dL (ref 3.5–5.0)
Alkaline Phosphatase: 52 U/L (ref 38–126)
Anion gap: 7 (ref 5–15)
BUN: 14 mg/dL (ref 6–20)
CO2: 24 mmol/L (ref 22–32)
Calcium: 8.8 mg/dL — ABNORMAL LOW (ref 8.9–10.3)
Chloride: 106 mmol/L (ref 98–111)
Creatinine, Ser: 0.85 mg/dL (ref 0.44–1.00)
GFR calc Af Amer: >60 mL/min (ref >60)
GFR calc non Af Amer: >60 mL/min (ref >60)
Glucose, Bld: 133 mg/dL — ABNORMAL HIGH (ref 70–99)
Potassium: 3.3 mmol/L — ABNORMAL LOW (ref 3.5–5.1)
Sodium: 137 mmol/L (ref 135–145)
Total Bilirubin: 0.6 mg/dL (ref 0.3–1.2)
Total Protein: 7.4 g/dL (ref 6.5–8.1)

## 2019-08-24 LAB — URINE DRUG SCREEN, QUALITATIVE (ARMC ONLY)
Amphetamines, Ur Screen: NOT DETECTED
Barbiturates, Ur Screen: NOT DETECTED
Benzodiazepine, Ur Scrn: NOT DETECTED
Cannabinoid 50 Ng, Ur ~~LOC~~: POSITIVE — AB
Cocaine Metabolite,Ur ~~LOC~~: POSITIVE — AB
MDMA (Ecstasy)Ur Screen: NOT DETECTED
Methadone Scn, Ur: NOT DETECTED
Opiate, Ur Screen: NOT DETECTED
Phencyclidine (PCP) Ur S: NOT DETECTED
Tricyclic, Ur Screen: NOT DETECTED

## 2019-08-24 LAB — ETHANOL: Alcohol, Ethyl (B): <10 mg/dL (ref <10)

## 2019-08-24 MED ORDER — NALOXONE HCL 2 MG/2ML IJ SOSY
0.4000 mg | PREFILLED_SYRINGE | Freq: Once | INTRAMUSCULAR | Status: AC
  Administered 2019-08-24: 0.4 mg via INTRAVENOUS
  Filled 2019-08-24: qty 2
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
## 2019-08-24 NOTE — ED Notes (Signed)
Requesting pt to give urine sample. States she is too drowsy at this time. 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 Will allow pt to become more alert before attempting to ambulate her for patient safety.
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
## 2019-08-24 NOTE — ED Notes (Signed)
Pt back from CT. States she is awake enough to go to bedside commode in room. With RN at side to assist if needed, pt to bathroom to give sample and back in bed without any difficulty.

## 2019-08-24 NOTE — ED Notes (Signed)
Pt to CT

## 2019-08-24 NOTE — ED Notes (Signed)
ED Provider at bedside. 

## 2019-08-24 NOTE — ED Notes (Addendum)
[employer] at bedside asking pt questions regarding events that occurred today.    Pt skin now warm and dry to touch.

## 2019-08-24 NOTE — ED Provider Notes (Signed)
Patient awake and alert.  Reiterates that this was a therapeutic misadventure.  No indication for IVC.  She is medically cleared for discharge home.   Willy Eddy, MD 08/24/19 1752

## 2020-03-22 ENCOUNTER — Other Ambulatory Visit: Payer: Self-pay

## 2020-03-22 ENCOUNTER — Emergency Department
Admission: EM | Admit: 2020-03-22 | Discharge: 2020-03-22 | Disposition: A | Discharge status: Home / Self Care | Payer: Medicaid Other | Attending: Emergency Medicine | Admitting: Emergency Medicine

## 2020-03-22 DIAGNOSIS — Z91010 Allergy to peanuts: Secondary | ICD-10-CM | POA: Insufficient documentation

## 2020-03-22 DIAGNOSIS — S0181XA Laceration without foreign body of other part of head, initial encounter: Secondary | ICD-10-CM | POA: Insufficient documentation

## 2020-03-22 DIAGNOSIS — F17210 Nicotine dependence, cigarettes, uncomplicated: Secondary | ICD-10-CM | POA: Insufficient documentation

## 2020-03-22 DIAGNOSIS — S0993XA Unspecified injury of face, initial encounter: Secondary | ICD-10-CM | POA: Diagnosis present

## 2020-03-22 MED ORDER — TETANUS-DIPHTH-ACELL PERTUSSIS 5-2.5-18.5 LF-MCG/0.5 IM SUSY
PREFILLED_SYRINGE | INTRAMUSCULAR | Status: AC
  Filled 2020-03-22: qty 0.5

## 2020-03-22 MED ORDER — CEPHALEXIN 500 MG PO CAPS: 500.0000 mg | ORAL_CAPSULE | Freq: Two times a day (BID) | ORAL | 0 refills | Status: AC

## 2020-03-22 MED ORDER — OXYCODONE-ACETAMINOPHEN 5-325 MG PO TABS
1.0000 | ORAL_TABLET | Freq: Once | ORAL | Status: AC
  Administered 2020-03-22: 1 via ORAL
  Filled 2020-03-22: qty 1

## 2020-03-22 MED ORDER — MICROFIBRILLAR COLL HEMOSTAT EX POWD
1.0000 g | Freq: Once | CUTANEOUS | Status: AC
  Administered 2020-03-22: 1 g via TOPICAL
  Filled 2020-03-22: qty 5

## 2020-03-22 MED ORDER — CEFAZOLIN SODIUM-DEXTROSE 2-4 GM/100ML-% IV SOLN
2.0000 g | Freq: Once | INTRAVENOUS | Status: AC
  Administered 2020-03-22: 2 g via INTRAVENOUS
  Filled 2020-03-22: qty 100

## 2020-03-22 MED ORDER — TRANEXAMIC ACID-NACL 1000-0.7 MG/100ML-% IV SOLN
1000.0000 mg | INTRAVENOUS | Status: AC
  Administered 2020-03-22: 1000 mg via INTRAVENOUS
  Filled 2020-03-22: qty 100

## 2020-03-22 MED ORDER — OXYCODONE-ACETAMINOPHEN 5-325 MG PO TABS: 1.0000 | ORAL_TABLET | ORAL | 0 refills | Status: AC | PRN
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
## 2020-03-22 NOTE — Discharge Instructions (Signed)
Keep laceration dry and clean. Wash with warm water and soap. Apply topical bacitracin. Protect from the sun to minimize scarring. Cover it with SPF 70 or higher and use hat when out in the sun for 6-9 months. You received 10 stitches that should dissolve on their own and do not need to be removed.  Watch for signs of infection: pus, redness of the skin surrounding it, or fever. If these develop see your doctor or return to the ER for antibiotics.  Unfortunately as we discussed the big piece of skin was missing therefore you should expect a pretty large scar.  Also the nerve for the lower part of your face is seem to have been affected by discussed.  You may notice a facial droop or difficulty smiling with the left side of your face.  Hopefully as time goes the symptoms may get better.  Once you are fully healed you may follow-up with the plastic surgeon for revision of the wound.
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
## 2020-03-22 NOTE — ED Notes (Signed)
This RN entered room to discharge patient, during discharge bandage beginning to saturate again. Veronese MD responded to bedside and removed dressing and placed new quick clot, advised leave for 15 minutes and re-assess.

## 2020-03-22 NOTE — ED Notes (Signed)
Site noted to be oozing at this time, new dressing applied by [employer] with quick clot and new gauze and tape.
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
## 2020-03-22 NOTE — ED Notes (Signed)
This RN entered room to patient yelling for assistance, pt noted to be on sitting at sink. Dressing on left side for face noted to be saturated and dripping. Don Perking MD called to bedside and responded, April RN holding pressure to dressing.

## 2020-03-22 NOTE — ED Notes (Signed)
Writer in room with MD to assist due to continued bleeding as well as arterial bleed.

## 2020-03-22 NOTE — ED Notes (Signed)
Pt laying down talking on the phone at this time, Dr Don Perking at bedside changing dressing

## 2020-03-22 NOTE — ED Notes (Signed)
Don Perking MD at bedside suturing.

## 2020-12-11 ENCOUNTER — Emergency Department
Admission: EM | Admit: 2020-12-11 | Discharge: 2020-12-11 | Disposition: A | Discharge status: Home / Self Care | Payer: Medicaid Other | Attending: Emergency Medicine | Admitting: Emergency Medicine

## 2020-12-11 ENCOUNTER — Other Ambulatory Visit: Payer: Self-pay

## 2020-12-11 DIAGNOSIS — N898 Other specified noninflammatory disorders of vagina: Secondary | ICD-10-CM | POA: Insufficient documentation

## 2020-12-11 DIAGNOSIS — Z202 Contact with and (suspected) exposure to infections with a predominantly sexual mode of transmission: Secondary | ICD-10-CM | POA: Insufficient documentation

## 2020-12-11 DIAGNOSIS — Z91010 Allergy to peanuts: Secondary | ICD-10-CM | POA: Insufficient documentation

## 2020-12-11 DIAGNOSIS — F17210 Nicotine dependence, cigarettes, uncomplicated: Secondary | ICD-10-CM | POA: Insufficient documentation

## 2020-12-11 LAB — WET PREP, GENITAL
Clue Cells Wet Prep HPF POC: NONE SEEN
Sperm: NONE SEEN
Trich, Wet Prep: NONE SEEN
Yeast Wet Prep HPF POC: NONE SEEN

## 2020-12-11 LAB — CHLAMYDIA/NGC RT PCR (ARMC ONLY)
Chlamydia Tr: NOT DETECTED
N gonorrhoeae: DETECTED — AB

## 2020-12-11 MED ORDER — AZITHROMYCIN 500 MG PO TABS
1000.0000 mg | ORAL_TABLET | Freq: Once | ORAL | Status: AC
  Administered 2020-12-11: 1000 mg via ORAL
  Filled 2020-12-11: qty 2

## 2020-12-11 MED ORDER — CEFTRIAXONE SODIUM 1 G IJ SOLR
500.0000 mg | Freq: Once | INTRAMUSCULAR | Status: AC
  Administered 2020-12-11: 500 mg via INTRAMUSCULAR
  Filled 2020-12-11: qty 10

## 2020-12-11 NOTE — ED Notes (Signed)
RN called and notified pt that she tested positive for Gonorrhea and that the medication that she was given in the ER is going to treat it.

## 2020-12-11 NOTE — ED Triage Notes (Signed)
Pt comes via EMs with c/o sore throat. VSS

## 2020-12-11 NOTE — ED Triage Notes (Signed)
See first nurse note- pt also complaining of thick vaginal discharge with foul odor x3 weeks. Report smell has been worsening. Worried about potential STD.

## 2020-12-11 NOTE — Discharge Instructions (Signed)
Follow-up and they will ask him to help department if your STD test are positive.  They will need to recheck you in 1 to 2 weeks.  If you need additional medication we will call it into the pharmacy. Notify any current partners if your test are positive.  They will need to also be tested and treated.  Refrain from sex until both parties have been treated for 7 to 10 days
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
## 2021-10-13 IMAGING — CT CT HEAD W/O CM
2 series · 15 of 39 positions shown, 18 images · non-contrast
Comparison: No pertinent prior studies available for comparison.

CLINICAL DATA: Encephalopathy.

EXAM:
CT HEAD WITHOUT CONTRAST
TECHNIQUE: Contiguous axial images were obtained from the base of the skull
through the vertex without intravenous contrast.

[Series 2: head wo · axial · 0.42mm/px · z∈[-92,+27]mm · 12 of 29 slices shown, 15 images]
[im 3/29  brain]
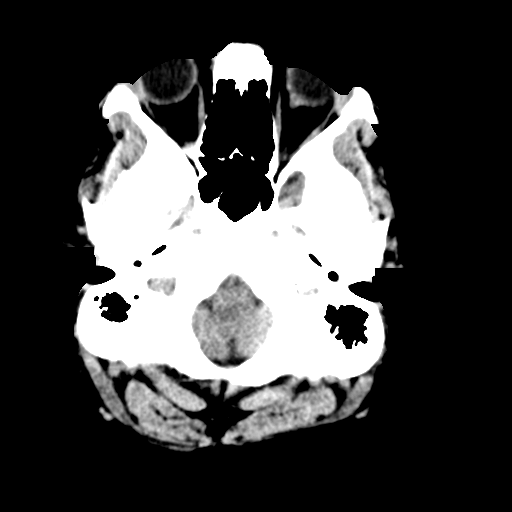
[im 3/29  bone]
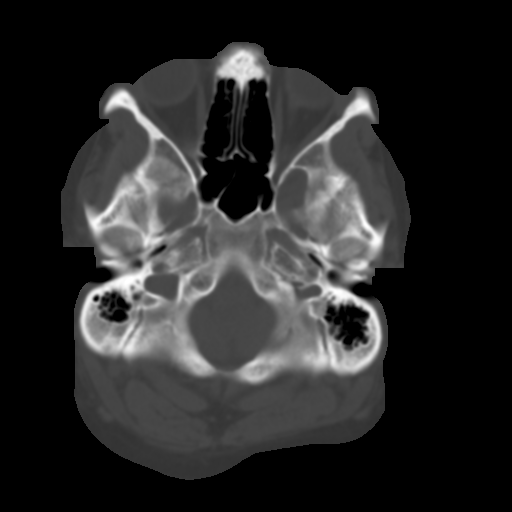
[im 5/29  brain]
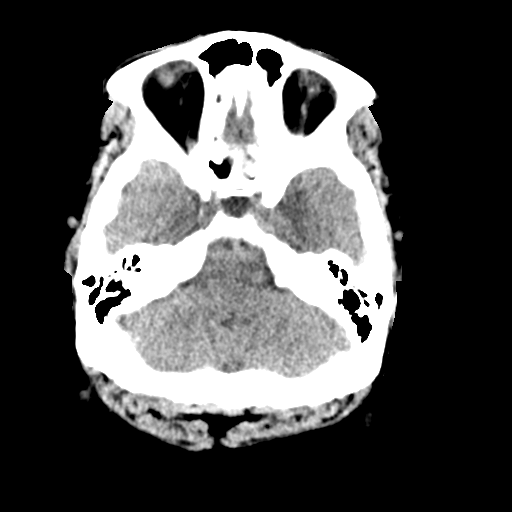
[im 7/29  brain]
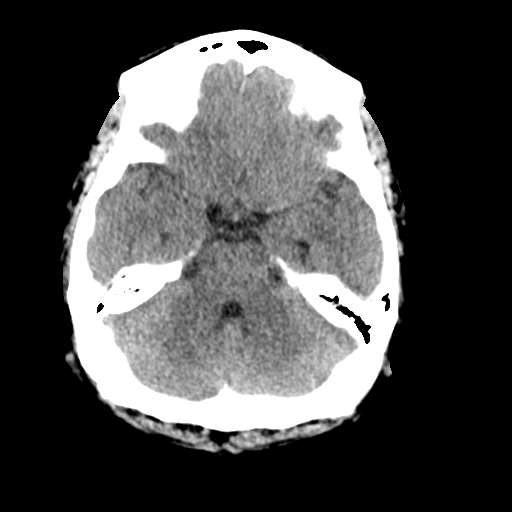
[im 9/29  brain]
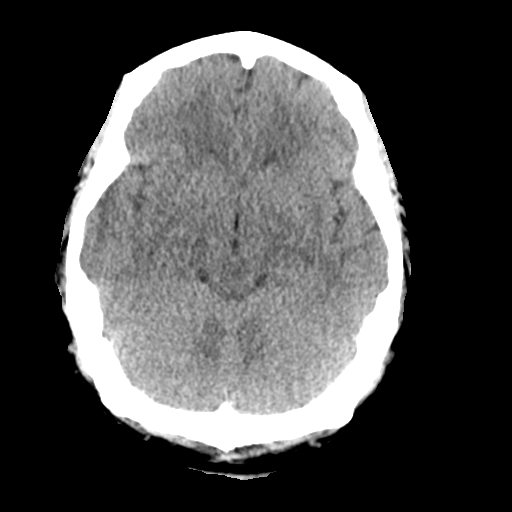
[im 12/29  brain]
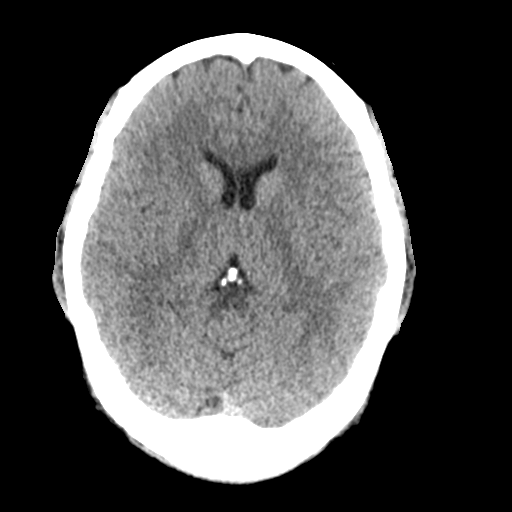
[im 12/29  bone]
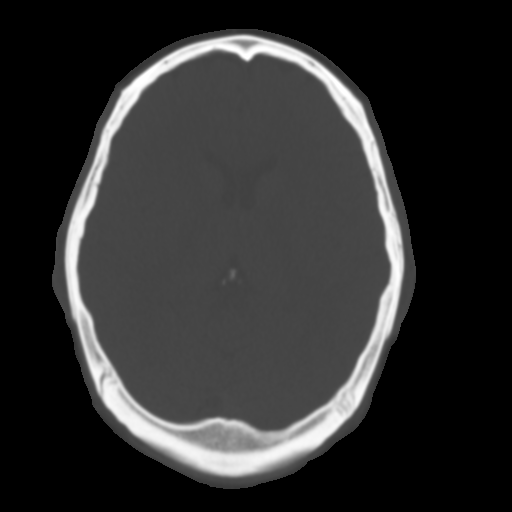
[im 14/29  brain]
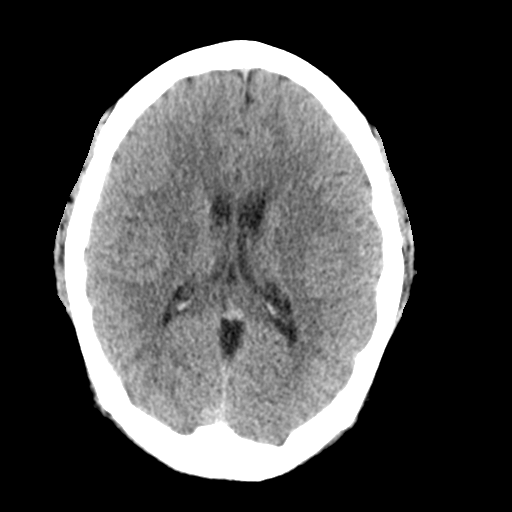
[im 16/29  brain]
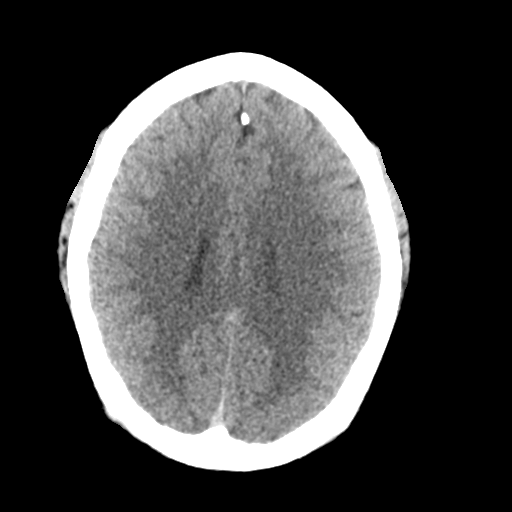
[im 18/29  brain]
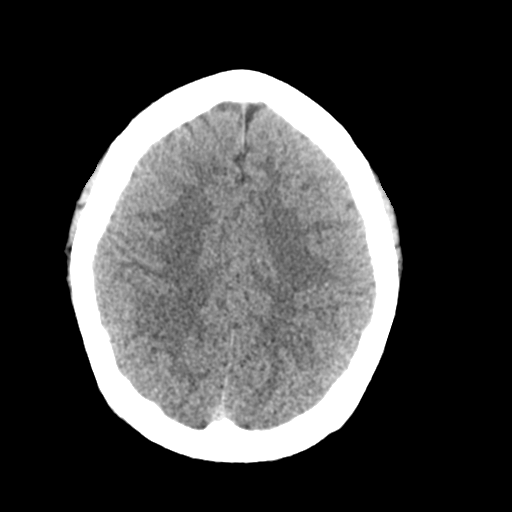
[im 21/29  brain]
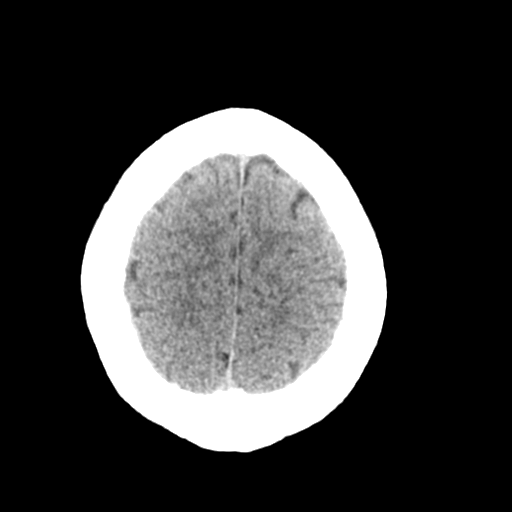
[im 21/29  bone]
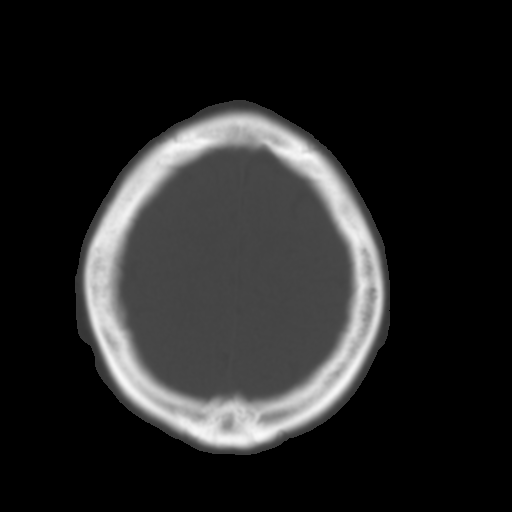
[im 23/29  brain]
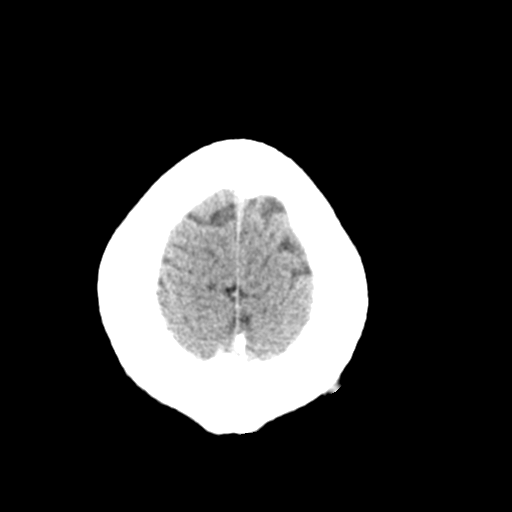
[im 25/29  brain]
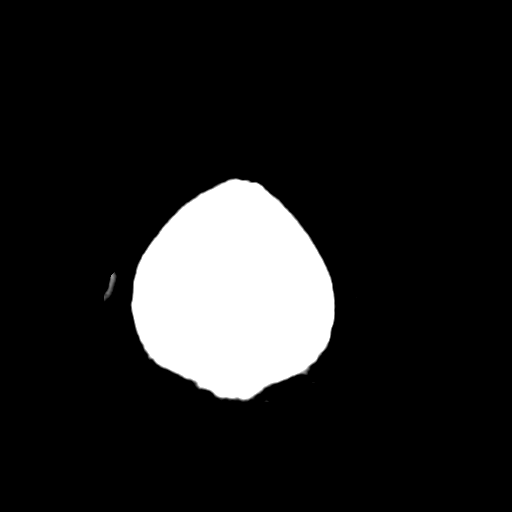
[im 27/29  brain]
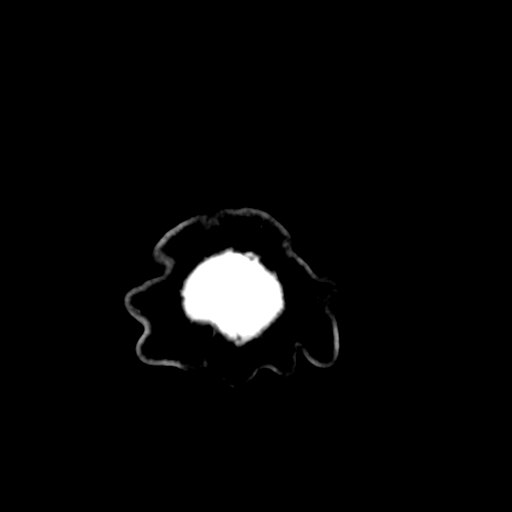

[Series 4: coronal soft tissue · coronal · 0.31mm/px · 3 of 67 slices shown]
[im 23/67  brain]
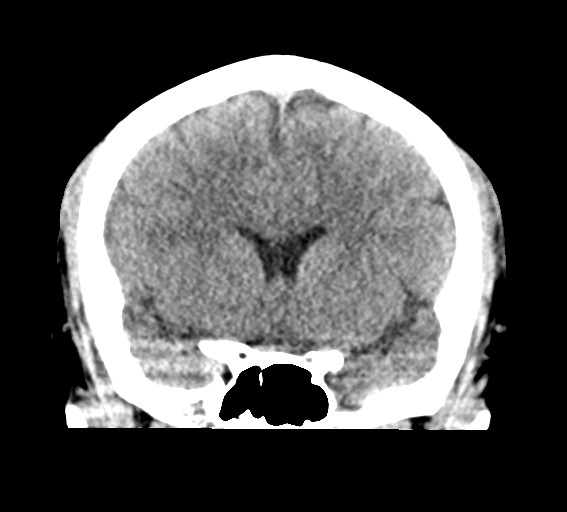
[im 30/67  brain]
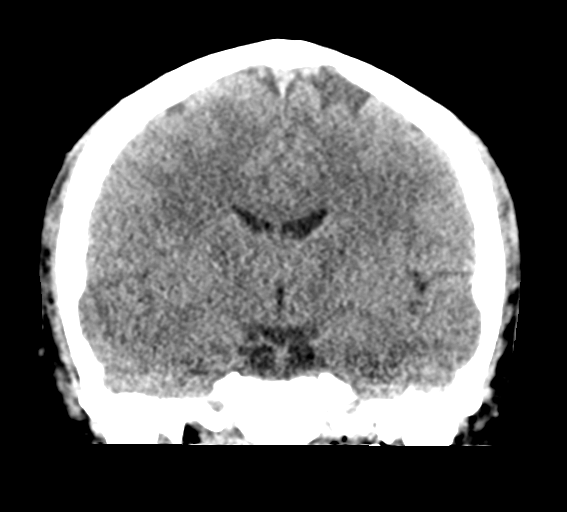
[im 37/67  brain]
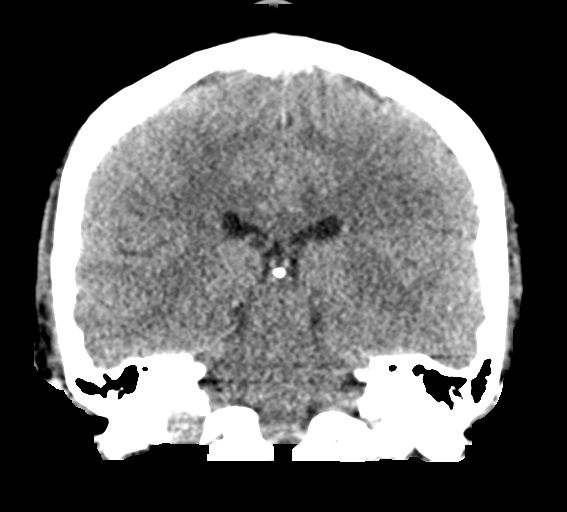

[15 of 39 positions shown; findings below may reference images not displayed]

FINDINGS: Brain:

Cerebral volume is normal for age.

There is no acute intracranial hemorrhage.

No demarcated cortical infarct.

No extra-axial fluid collection.

No evidence of intracranial mass.

No midline shift.

Vascular: No hyperdense vessel.

Skull: Normal. Negative for fracture or focal lesion.

Sinuses/Orbits: Visualized orbits show no acute finding. No
significant paranasal sinus disease or mastoid effusion at the
imaged levels.
IMPRESSION: Unremarkable non-contrast CT appearance of the brain. No evidence of
acute intracranial abnormality.

## 2021-10-13 IMAGING — DX DG CHEST 1V PORT
1 series · 1 of 1 positions shown · non-contrast
Comparison: 12/22/2010 chest radiograph.

CLINICAL DATA: Confusion

EXAM:
PORTABLE CHEST 1 VIEW

[chest ap]
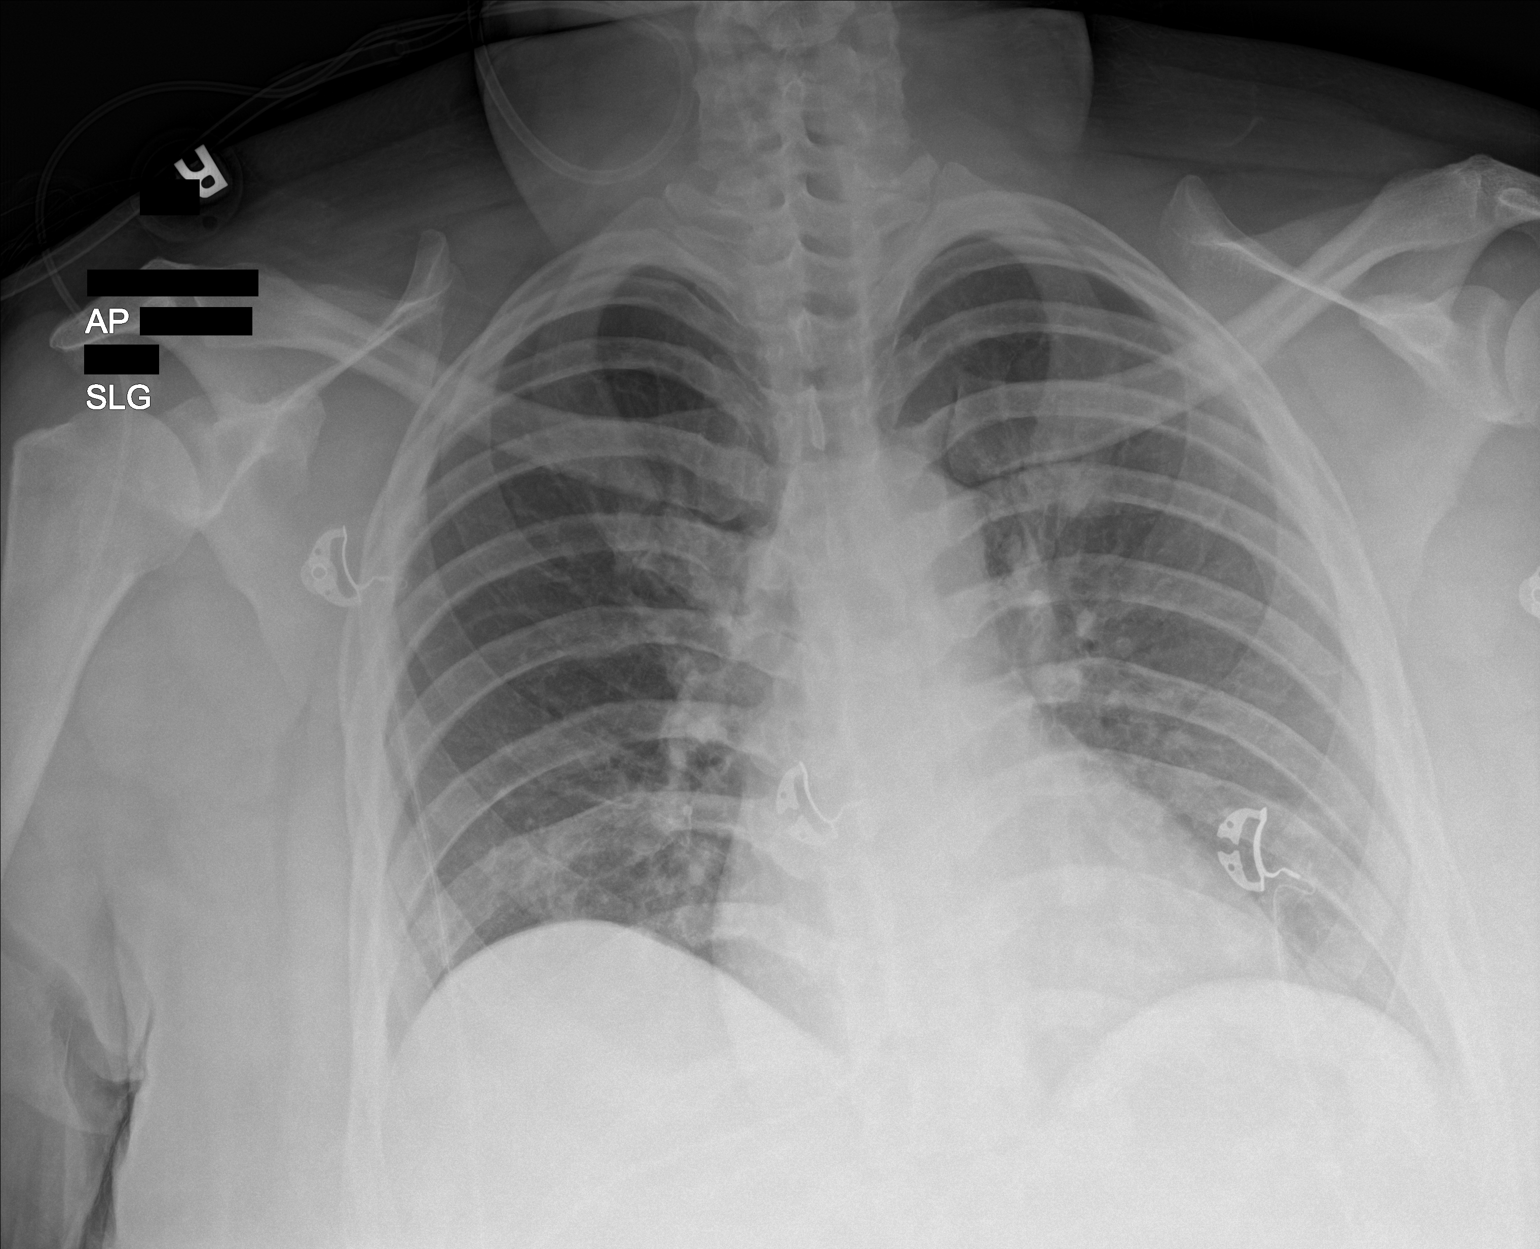

[1 of 1 positions shown; findings below may reference images not displayed]

FINDINGS: Hypoinflated lungs. Minimal bibasilar hazy opacities likely reflect
atelectasis. No pneumothorax or pleural effusion. Cardiomediastinal
silhouette is within normal limits. No acute osseous abnormality.
IMPRESSION: No focal airspace disease.  Bibasilar atelectasis.

## 2021-12-26 ENCOUNTER — Emergency Department
Admission: EM | Admit: 2021-12-26 | Discharge: 2021-12-26 | Discharge status: Against Medical Advice | Payer: Medicaid Other | Attending: Emergency Medicine | Admitting: Emergency Medicine

## 2021-12-26 ENCOUNTER — Other Ambulatory Visit: Payer: Self-pay

## 2021-12-26 DIAGNOSIS — Z5321 Procedure and treatment not carried out due to patient leaving prior to being seen by health care provider: Secondary | ICD-10-CM | POA: Insufficient documentation

## 2021-12-26 DIAGNOSIS — K0889 Other specified disorders of teeth and supporting structures: Secondary | ICD-10-CM | POA: Insufficient documentation

## 2021-12-26 LAB — COMPREHENSIVE METABOLIC PANEL
ALT: 11 U/L (ref 0–44)
AST: 18 U/L (ref 15–41)
Albumin: 3.4 g/dL — ABNORMAL LOW (ref 3.5–5.0)
Alkaline Phosphatase: 51 U/L (ref 38–126)
Anion gap: 9 (ref 5–15)
BUN: 11 mg/dL (ref 6–20)
CO2: 20 mmol/L — ABNORMAL LOW (ref 22–32)
Calcium: 8.7 mg/dL — ABNORMAL LOW (ref 8.9–10.3)
Chloride: 109 mmol/L (ref 98–111)
Creatinine, Ser: 0.73 mg/dL (ref 0.44–1.00)
GFR, Estimated: >60 mL/min (ref >60)
Glucose, Bld: 93 mg/dL (ref 70–99)
Potassium: 3.8 mmol/L (ref 3.5–5.1)
Sodium: 138 mmol/L (ref 135–145)
Total Bilirubin: 0.5 mg/dL (ref 0.3–1.2)
Total Protein: 7.1 g/dL (ref 6.5–8.1)

## 2021-12-26 LAB — ACETAMINOPHEN LEVEL: Acetaminophen (Tylenol), Serum: 61 ug/mL — ABNORMAL HIGH (ref 10–30)

## 2021-12-26 LAB — CBC WITH DIFFERENTIAL/PLATELET
Abs Immature Granulocytes: 0.02 10*3/uL (ref 0.00–0.07)
Basophils Absolute: 0 10*3/uL (ref 0.0–0.1)
Basophils Relative: 0 %
Eosinophils Absolute: 0.1 10*3/uL (ref 0.0–0.5)
Eosinophils Relative: 1 %
HCT: 38.9 % (ref 36.0–46.0)
Hemoglobin: 12.7 g/dL (ref 12.0–15.0)
Immature Granulocytes: 0 %
Lymphocytes Relative: 18 %
Lymphs Abs: 1.6 10*3/uL (ref 0.7–4.0)
MCH: 28.3 pg (ref 26.0–34.0)
MCHC: 32.6 g/dL (ref 30.0–36.0)
MCV: 86.8 fL (ref 80.0–100.0)
Monocytes Absolute: 0.8 10*3/uL (ref 0.1–1.0)
Monocytes Relative: 9 %
Neutro Abs: 6.5 10*3/uL (ref 1.7–7.7)
Neutrophils Relative %: 72 %
Platelets: 374 10*3/uL (ref 150–400)
RBC: 4.48 MIL/uL (ref 3.87–5.11)
RDW: 15.3 % (ref 11.5–15.5)
WBC: 9 10*3/uL (ref 4.0–10.5)
nRBC: 0 % (ref 0.0–0.2)

## 2021-12-26 NOTE — ED Notes (Signed)
Pt requested IV to be removed and was leaving, advised pt to return if sxs worsen. Pt verbalized understanding.  Updated pt that if she went to another hospital that her lab work would be visible to other local hospitals as well.

## 2021-12-26 NOTE — ED Notes (Addendum)
Note entered in error

## 2021-12-26 NOTE — ED Triage Notes (Addendum)
Pt BIB EMS for dental pain on the left upper and lower side. Per pt, pain started about a month ago. Pt has taken tylenol and ibuprofen. Per EMS, pt has taken 7g of tylenol and 6g of ibuprofen.

## 2023-04-13 ENCOUNTER — Emergency Department
Admission: EM | Admit: 2023-04-13 | Discharge: 2023-04-13 | Disposition: A | Discharge status: Home / Self Care | Payer: MEDICAID | Attending: Emergency Medicine | Admitting: Emergency Medicine

## 2023-04-13 ENCOUNTER — Other Ambulatory Visit: Payer: Self-pay

## 2023-04-13 DIAGNOSIS — N898 Other specified noninflammatory disorders of vagina: Secondary | ICD-10-CM | POA: Diagnosis present

## 2023-04-13 LAB — WET PREP, GENITAL
Clue Cells Wet Prep HPF POC: NONE SEEN
Sperm: NONE SEEN
Trich, Wet Prep: NONE SEEN
WBC, Wet Prep HPF POC: <10 (ref <10)
Yeast Wet Prep HPF POC: NONE SEEN

## 2023-04-13 LAB — CHLAMYDIA/NGC RT PCR (ARMC ONLY)
Chlamydia Tr: NOT DETECTED
N gonorrhoeae: NOT DETECTED

## 2023-04-13 MED ORDER — CEFTRIAXONE SODIUM 1 G IJ SOLR
500.0000 mg | Freq: Once | INTRAMUSCULAR | Status: AC
  Administered 2023-04-13: 500 mg via INTRAMUSCULAR
  Filled 2023-04-13: qty 10

## 2023-04-13 MED ORDER — DOXYCYCLINE HYCLATE 100 MG PO CAPS: 100.0000 mg | ORAL_CAPSULE | Freq: Two times a day (BID) | ORAL | 0 refills | Status: AC

## 2023-04-13 MED ORDER — STERILE WATER FOR INJECTION IJ SOLN
INTRAMUSCULAR | Status: AC
  Filled 2023-04-13: qty 10

## 2023-04-13 MED ORDER — AZITHROMYCIN 500 MG PO TABS
1000.0000 mg | ORAL_TABLET | Freq: Every day | ORAL | Status: DC
  Administered 2023-04-13: 1000 mg via ORAL
  Filled 2023-04-13: qty 2

## 2023-04-13 NOTE — ED Notes (Signed)
 See triage notes. Patient c/o vaginal discharge for the past two weeks. Patient is concerned that she has an STD. Unable to obtain blood pressure due to the patient being extremely restless. Patient admits to cocaine use this morning.

## 2023-04-13 NOTE — ED Triage Notes (Signed)
 Pt to ED for vaginal discharge for 2 weeks. States thinks she has STD.  Pt with very loud and restless in triage. States cocaine use this am.
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
## 2023-05-14 ENCOUNTER — Encounter: Payer: Self-pay | Admitting: Emergency Medicine

## 2023-05-14 ENCOUNTER — Emergency Department
Admission: EM | Admit: 2023-05-14 | Discharge: 2023-05-14 | Disposition: A | Discharge status: Home / Self Care | Payer: MEDICAID | Attending: Emergency Medicine | Admitting: Emergency Medicine

## 2023-05-14 ENCOUNTER — Other Ambulatory Visit: Payer: Self-pay

## 2023-05-14 DIAGNOSIS — N898 Other specified noninflammatory disorders of vagina: Secondary | ICD-10-CM | POA: Insufficient documentation

## 2023-05-14 LAB — WET PREP, GENITAL
Clue Cells Wet Prep HPF POC: NONE SEEN
Sperm: NONE SEEN
Trich, Wet Prep: NONE SEEN
WBC, Wet Prep HPF POC: <10 (ref <10)
Yeast Wet Prep HPF POC: NONE SEEN

## 2023-05-14 LAB — CHLAMYDIA/NGC RT PCR (ARMC ONLY)
Chlamydia Tr: NOT DETECTED
N gonorrhoeae: NOT DETECTED

## 2023-05-14 NOTE — ED Provider Notes (Signed)
 [facility] Provider Note    Event Date/Time   First MD Initiated Contact with Patient 05/14/23 1104     (approximate)   History   Body Odor   HPI Christina Wiley is a 33 y.o. female presenting today for foul odor.  Patient notes over the past several months she has had foul-smelling discharge from the vagina and odd smelling urine.  Denies any blood in her urine or vaginal bleeding.  No pain when she urinates.  No lower abdominal pain.  Denies any pain symptoms elsewhere.  Patient was seen in the ED 1 month ago for similar concerns and at that time had negative workup for gonorrhea, chlamydia, BV, trichomonas, or yeast.  Urine was not tested at that time.     Physical Exam   Triage Vital Signs: ED Triage Vitals  Encounter Vitals Group     BP 05/14/23 1045 125/76     Systolic BP Percentile --      Diastolic BP Percentile --      Pulse Rate 05/14/23 1045 96     Resp 05/14/23 1045 20     Temp 05/14/23 1045 98.3 F (36.8 C)     Temp Source 05/14/23 1045 Oral     SpO2 05/14/23 1045 95 %     Weight 05/14/23 1044 (!) 350 lb (158.8 kg)     Height 05/14/23 1044 5\' 7"  (1.702 m)     Head Circumference --      Peak Flow --      Pain Score 05/14/23 1044 0     Pain Loc --      Pain Education --      Exclude from Growth Chart --     Most recent vital signs: Vitals:   05/14/23 1045  BP: 125/76  Pulse: 96  Resp: 20  Temp: 98.3 F (36.8 C)  SpO2: 95%   I have reviewed the vital signs. General:  Awake, alert, no acute distress. Head:  Normocephalic, Atraumatic. EENT:  PERRL, EOMI, Oral mucosa pink and moist, Neck is supple. Cardiovascular: Regular rate, 2+ distal pulses. Respiratory:  Normal respiratory effort, symmetrical expansion, no distress.   Abdomen: Soft, nontender, nondistended Extremities:  Moving all four extremities through full ROM without pain.   Neuro:  Alert and oriented.  Interacting appropriately.   Skin:  Warm, dry, no rash.    Psych: Appropriate affect.    ED Results / Procedures / Treatments   Labs (all labs ordered are listed, but only abnormal results are displayed) Labs Reviewed  WET PREP, GENITAL  CHLAMYDIA/NGC RT PCR ([facility] ONLY)               EKG    RADIOLOGY    PROCEDURES:  Critical Care performed: No  Procedures   MEDICATIONS ORDERED IN ED: Medications - No data to display   IMPRESSION / MDM / ASSESSMENT AND PLAN / ED COURSE  I reviewed the triage vital signs and the nursing notes.                              Differential diagnosis includes, but is not limited to, UTI, STI  Patient's presentation is most consistent with acute complicated illness / injury requiring diagnostic workup.  Patient is a 33 year old female presenting today for foul body odor either coming from her vagina or urine.  Will obtain UA and STI testing.  No indication for additional blood work or  other abdominal imaging.  Vital signs otherwise stable.  Negative for BV, gonorrhea, chlamydia, trichomonas, yeast.  Asked patient multiple times provide urine sample and she was refusing to.  Stated we could not tell if she had a UTI if she did not provide a urine sample.  Still unwilling.  Will discharge her with no other acute pathology at this time.  Clinical Course as of 05/14/23 1319  Sat May 14, 2023  1244 Checked on patient again for urine sample.  Patient was refusing to talk to me and kept trying to sleep and roll over the opposite direction.  I stated that we could not let her sleep here in the emergency department if she had no other acute complaints.  She stated she would try to give a urine sample. [DW]  1318 Patient still not wanting to provide urine sample.  With no other ongoing pathology, I stated we cannot keep her here in the ED longer and she can follow-up outpatient as needed. [DW]    Clinical Course User Index [DW] Janith Lima, MD     FINAL CLINICAL IMPRESSION(S) / ED DIAGNOSES   Final  diagnoses:  Vaginal discharge     Rx / DC Orders   ED Discharge Orders     None        Note:  This document was prepared using Dragon voice recognition software and may include unintentional dictation errors.   Janith Lima, MD 05/14/23 [phone]

## 2023-05-14 NOTE — Discharge Instructions (Signed)
 You can call the gynecology team listed on your paperwork for outpatient follow-up as needed.

## 2023-05-14 NOTE — ED Notes (Signed)
 Pt is still sleeping with lights off. No urine sample has been done.

## 2023-05-14 NOTE — ED Notes (Signed)
 EDP went to talk to pt again, pt still sleeping.

## 2023-05-14 NOTE — ED Notes (Signed)
 First encounter with pt. This RN went into room to introduce self and get samples and pt found to be asleep with lights off. Pt was able to be roused but quickly fell asleep again. Woke pt up again, pt was cooperative with getting samples for gc/chlam and wet prep but then said unable to urinate at this time and wanted to have lights turned off again. EDP aware.

## 2023-05-14 NOTE — ED Triage Notes (Signed)
 Pt via POV from home. Pt c/o body odor for the past month states she was seen a month ago for same. Report strong smelling urine. Denies vaginal discharge. Denies pain. Reports that when she was seen a month ago and states she didn't have a UTI. Pt is A&OX4 and NAD

## 2023-05-14 NOTE — ED Notes (Signed)
 Dr Anner Crete at bedside again explaining to pt that she is going to be discharged. Pt has been sleeping this whole time.

## 2023-05-30 ENCOUNTER — Other Ambulatory Visit: Payer: Self-pay

## 2023-05-30 ENCOUNTER — Emergency Department
Admission: EM | Admit: 2023-05-30 | Discharge: 2023-05-30 | Disposition: A | Discharge status: Against Medical Advice | Payer: MEDICAID | Attending: Emergency Medicine | Admitting: Emergency Medicine

## 2023-05-30 DIAGNOSIS — Z5321 Procedure and treatment not carried out due to patient leaving prior to being seen by health care provider: Secondary | ICD-10-CM | POA: Diagnosis not present

## 2023-05-30 DIAGNOSIS — L292 Pruritus vulvae: Secondary | ICD-10-CM | POA: Insufficient documentation

## 2023-05-30 NOTE — ED Triage Notes (Signed)
 Pt told registration she was checking in for "suicidal" but when this RN attempts to triage pt, pt states she said "STI" and is here for vaginal itching and refuses to complete triage process. A&Ox4. Denies any SI/HI. Refusing any further care and walking out of ED lobby.
